# Patient Record
Sex: Male | Born: 1990 | Race: Asian | Hispanic: No | Marital: Single | Smoking: Never smoker
Health system: Southern US, Community
[De-identification: ages and names within clinical notes are randomized; demographics above are authoritative.]

---

## 2016-03-20 ENCOUNTER — Emergency Department (HOSPITAL_COMMUNITY)
Admission: EM | Admit: 2016-03-20 | Discharge: 2016-03-20 | Disposition: A | Discharge status: Home / Self Care | Payer: PRIVATE HEALTH INSURANCE | Attending: Emergency Medicine | Admitting: Emergency Medicine

## 2016-03-20 ENCOUNTER — Encounter (HOSPITAL_COMMUNITY): Payer: Self-pay | Admitting: Nurse Practitioner

## 2016-03-20 ENCOUNTER — Emergency Department (HOSPITAL_COMMUNITY): Payer: PRIVATE HEALTH INSURANCE

## 2016-03-20 DIAGNOSIS — Y998 Other external cause status: Secondary | ICD-10-CM | POA: Insufficient documentation

## 2016-03-20 DIAGNOSIS — Z79899 Other long term (current) drug therapy: Secondary | ICD-10-CM | POA: Diagnosis not present

## 2016-03-20 DIAGNOSIS — Y9367 Activity, basketball: Secondary | ICD-10-CM | POA: Diagnosis not present

## 2016-03-20 DIAGNOSIS — M25561 Pain in right knee: Secondary | ICD-10-CM | POA: Insufficient documentation

## 2016-03-20 DIAGNOSIS — X501XXA Overexertion from prolonged static or awkward postures, initial encounter: Secondary | ICD-10-CM | POA: Diagnosis not present

## 2016-03-20 DIAGNOSIS — Y929 Unspecified place or not applicable: Secondary | ICD-10-CM | POA: Diagnosis not present

## 2016-03-20 MED ORDER — OXYCODONE-ACETAMINOPHEN 5-325 MG PO TABS
1.0000 | ORAL_TABLET | Freq: Once | ORAL | Status: AC
  Administered 2016-03-20: 1 via ORAL
  Filled 2016-03-20: qty 1

## 2016-03-20 MED ORDER — NAPROXEN 500 MG PO TABS: 500.0000 mg | ORAL_TABLET | Freq: Two times a day (BID) | ORAL | 0 refills | Status: AC

## 2016-03-20 NOTE — ED Notes (Signed)
Patient has swelling to right knee.

## 2016-03-20 NOTE — ED Provider Notes (Signed)
[facility]-EMERGENCY DEPT Provider Note   CSN: [id number] Arrival date & time: 03/20/16  1845     History   Chief Complaint Chief Complaint  Patient presents with  . Knee Pain    HPI Oscar Freeman is a 26 y.o. male otherwise healthy presenting with acute onset right knee pain while playing basketball. Patient states that he twisted while playing and felt the pain he has not been able to bear weight since and has tenderness on the lateral right knee and behind his knee. He denies any fall or blunt trauma. denies numbness or tingling in extremities. No other symptoms.  HPI  History reviewed. No pertinent past medical history.  There are no active problems to display for this patient.   History reviewed. No pertinent surgical history.     Home Medications    Prior to Admission medications   Medication Sig Start Date End Date Taking? Authorizing Provider  naproxen (NAPROSYN) 500 MG tablet Take 1 tablet (500 mg total) by mouth 2 (two) times daily. 03/20/16   Georgiana ShoreJessica B Keigan Girten, PA-C    Family History History reviewed. No pertinent family history.  Social History Social History  Substance Use Topics  . Smoking status: Never Smoker  . Smokeless tobacco: Never Used  . Alcohol use No     Allergies   Patient has no known allergies.   Review of Systems Review of Systems  Constitutional: Negative for chills and fever.  HENT: Negative for ear pain.   Eyes: Negative for pain and visual disturbance.  Respiratory: Negative for cough, chest tightness, shortness of breath and wheezing.   Cardiovascular: Negative for chest pain and palpitations.  Gastrointestinal: Negative for abdominal pain, nausea and vomiting.  Musculoskeletal: Positive for arthralgias. Negative for back pain, joint swelling, myalgias, neck pain and neck stiffness.  Skin: Negative for color change, pallor and rash.  Neurological: Negative for dizziness, seizures, syncope, weakness, light-headedness and  numbness.  All other systems reviewed and are negative.    Physical Exam Updated Vital Signs BP 112/74 (BP Location: Right Arm)   Pulse 62   Temp 97.3 F (36.3 C) (Oral)   Resp 15   SpO2 99%   Physical Exam  Constitutional: He appears well-developed and well-nourished. No distress.  Patient is nontoxic-appearing, sitting comfortably in chair in no acute distress.  HENT:  Head: Normocephalic and atraumatic.  Eyes: Conjunctivae and EOM are normal. Pupils are equal, round, and reactive to light.  Neck: Normal range of motion. Neck supple.  Cardiovascular: Normal rate, regular rhythm, normal heart sounds and intact distal pulses.   No murmur heard. Pulmonary/Chest: Effort normal and breath sounds normal. No respiratory distress. He has no wheezes. He has no rales. He exhibits no tenderness.  Musculoskeletal: Normal range of motion. He exhibits tenderness. He exhibits no edema or deformity.  Patient had full passive range of motion of his right knee. Joint was very stable. No swelling noted. Neurovascularly intact. Patient reported tenderness on the lateral aspect of his right knee when palpated. No laxity, negative anterior drawer test.  Neurological: He is alert. No sensory deficit. He exhibits normal muscle tone. Coordination normal.  Skin: Skin is warm and dry. Capillary refill takes less than 2 seconds. No rash noted. He is not diaphoretic. No erythema. No pallor.  Psychiatric: He has a normal mood and affect. His behavior is normal.  Nursing note and vitals reviewed.    ED Treatments / Results  Labs (all labs ordered are listed, but only abnormal results are  displayed) Labs Reviewed - No data to display  EKG  EKG Interpretation None       Radiology Dg Knee Complete 4 Views Right  Result Date: 03/20/2016 CLINICAL DATA:  Right knee injury playing basketball today. Lateral right knee pain. Initial encounter. EXAM: RIGHT KNEE - COMPLETE 4+ VIEW COMPARISON:  None.  FINDINGS: A linear calcific density is seen along the lateral aspect of the lateral tibial plateau, suspicious for avulsion fracture. This is of indeterminate age radiographically. No other fracture or bone lesion identified. No evidence of knee joint effusion or arthropathy. No other bone lesions identified. IMPRESSION: Linear calcific density along lateral aspect of lateral tibial plateau, suspicious for avulsion fracture. This is of indeterminate age radiographically. Recommend clinical correlation for point tenderness at this site. Electronically Signed   By: Myles Rosenthal M.D.   On: 03/20/2016 21:35    Procedures Procedures (including critical care time)  Medications Ordered in ED Medications  oxyCODONE-acetaminophen (PERCOCET/ROXICET) 5-325 MG per tablet 1 tablet (1 tablet Oral Given 03/20/16 2211)     Initial Impression / Assessment and Plan / ED Course  I have reviewed the triage vital signs and the nursing notes.  Pertinent labs & imaging results that were available during my care of the patient were reviewed by me and considered in my medical decision making (see chart for details).    26 year old male presenting with right knee pain of sudden onset while playing basketball. No fall or blunt trauma. He thought that he might have twisted the wrong way. Xray showed area of possible avulsion of unknown date. Mechanism of injury does not correlate with fracture due to lack of trauma or fall at this time. Reassuring exam with stable joint. No swelling noted. Patient did have tenderness on the lateral aspect of the knee. neurovascularly intact.  Discharge home with [facility], nsaids and close follow up with orthopedics on Monday.  Patient's right knee was placed in a brace and provided crutches.  Discussed strict return precautions. Patient was advised to return to the emergency department if experiencing any worsening of symptoms. Patient understood instructions and agreed with discharge  plan.  Final Clinical Impressions(s) / ED Diagnoses   Final diagnoses:  Acute pain of right knee    New Prescriptions Discharge Medication List as of 03/20/2016 11:18 PM    START taking these medications   Details  naproxen (NAPROSYN) 500 MG tablet Take 1 tablet (500 mg total) by mouth 2 (two) times daily., Starting Sat 03/20/2016, Print         Georgiana Shore, PA-C 03/21/16 1610    Jacalyn Lefevre, MD 03/21/16 [phone]

## 2016-03-20 NOTE — ED Notes (Signed)
Patient d/c'd self care.  F/U and medications reviewed.  Patient verbalized understanding. 

## 2016-03-20 NOTE — ED Triage Notes (Signed)
Pt of right knee pain of unknown origin. No obvious swelling. Pain 9/10.

## 2017-11-15 IMAGING — CR DG KNEE COMPLETE 4+V*R*
4 series · 4 of 4 positions shown · non-contrast
Comparison: None.

CLINICAL DATA: Right knee injury playing basketball today. Lateral
right knee pain. Initial encounter.

EXAM:
RIGHT KNEE - COMPLETE 4+ VIEW

[t knee ap right]
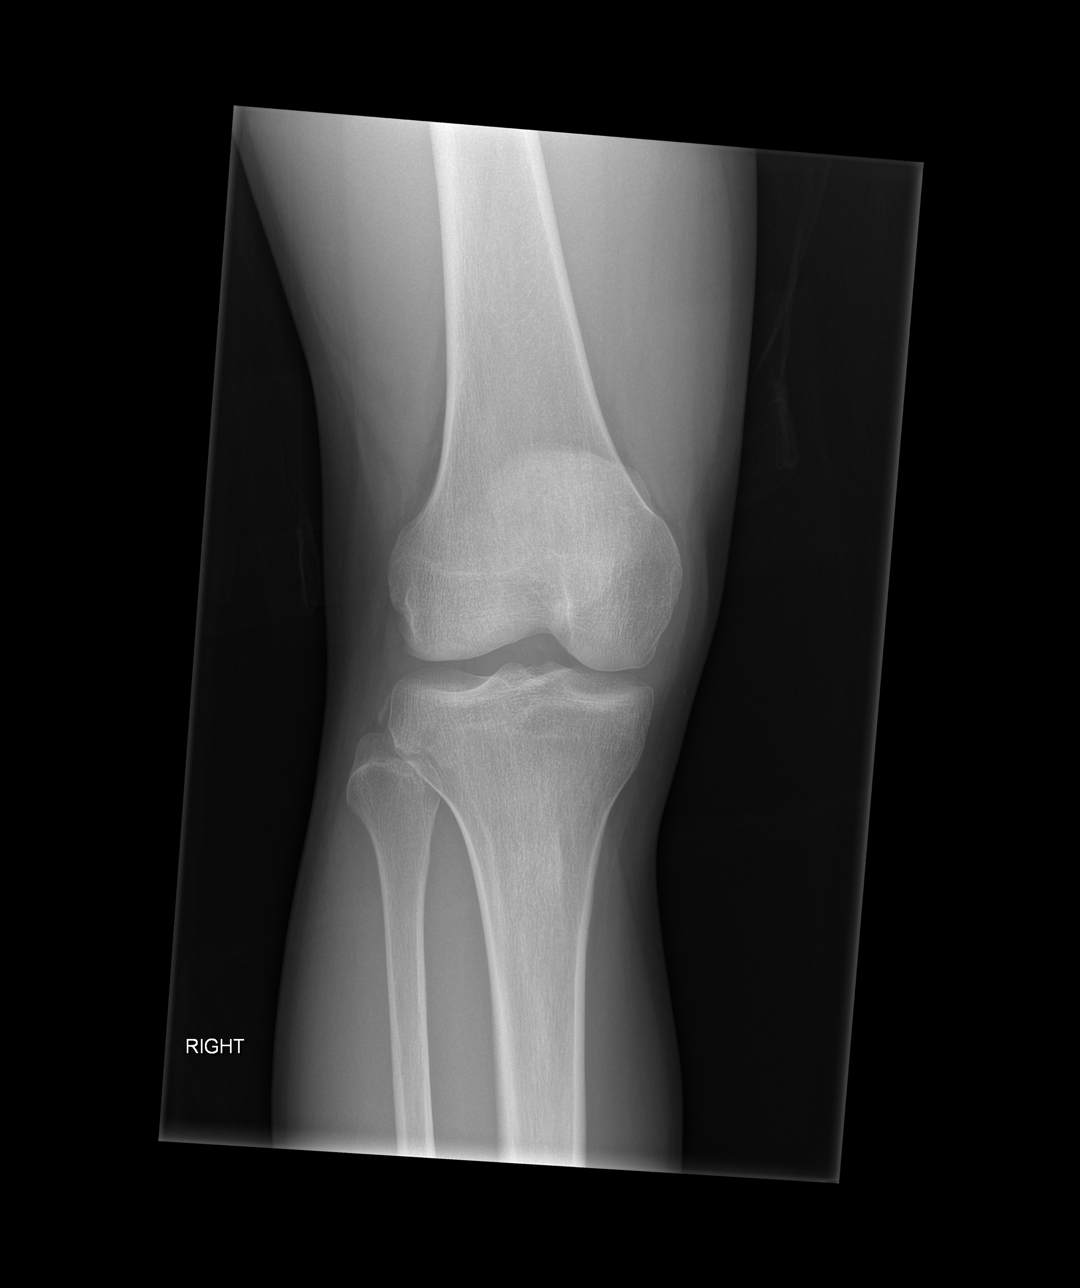

[t knee obl right (1 of 2)]
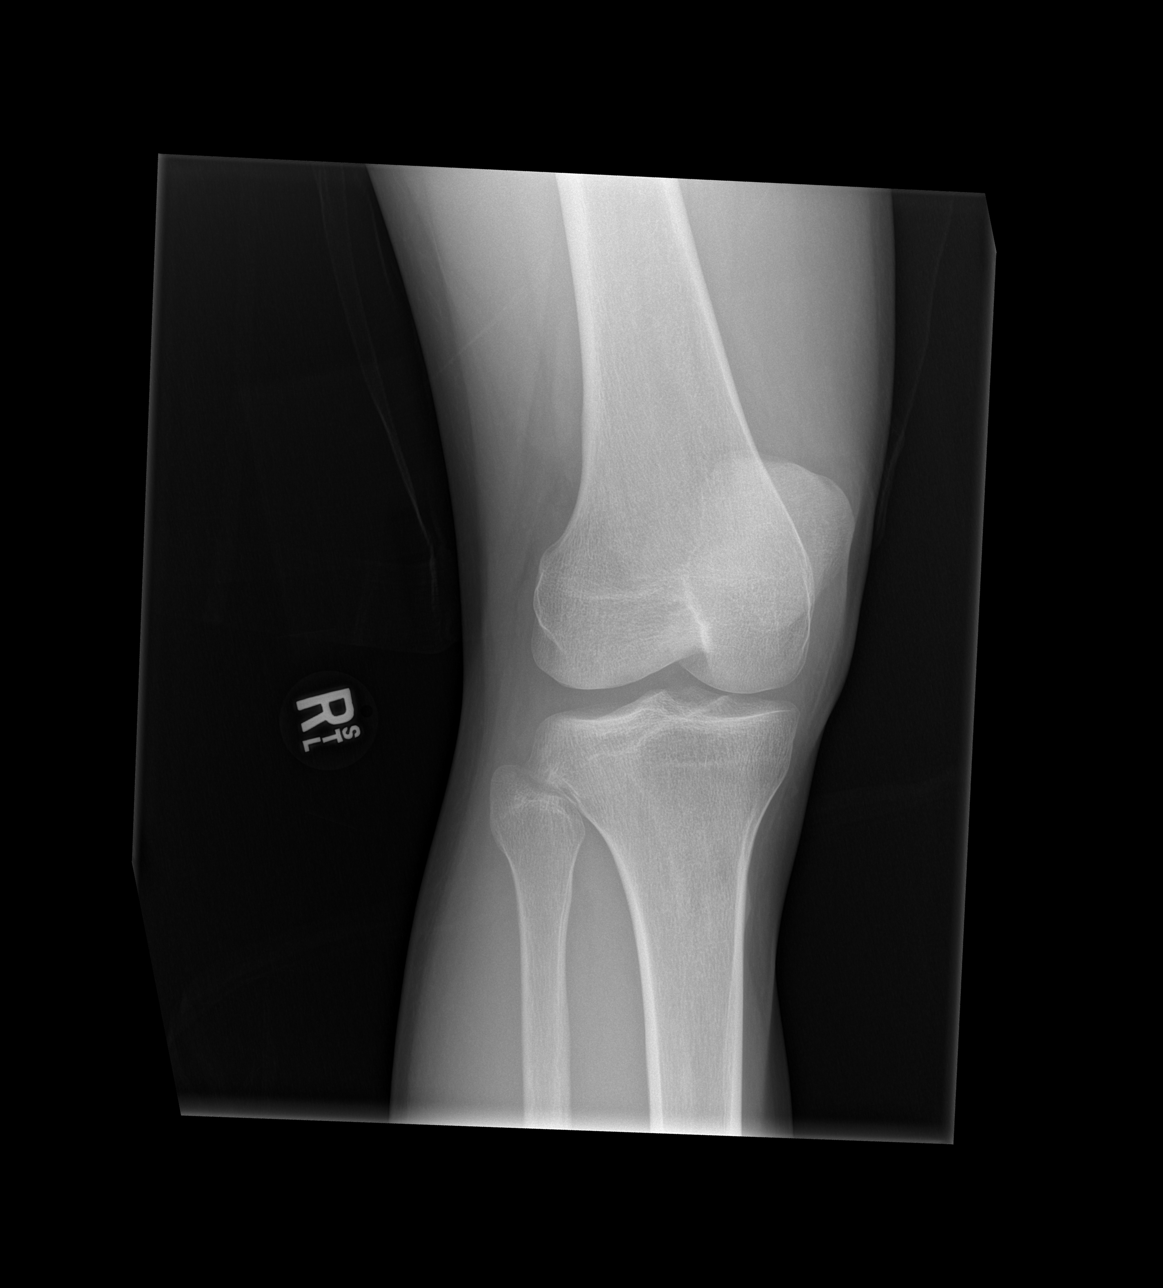

[t knee obl right (2 of 2)]
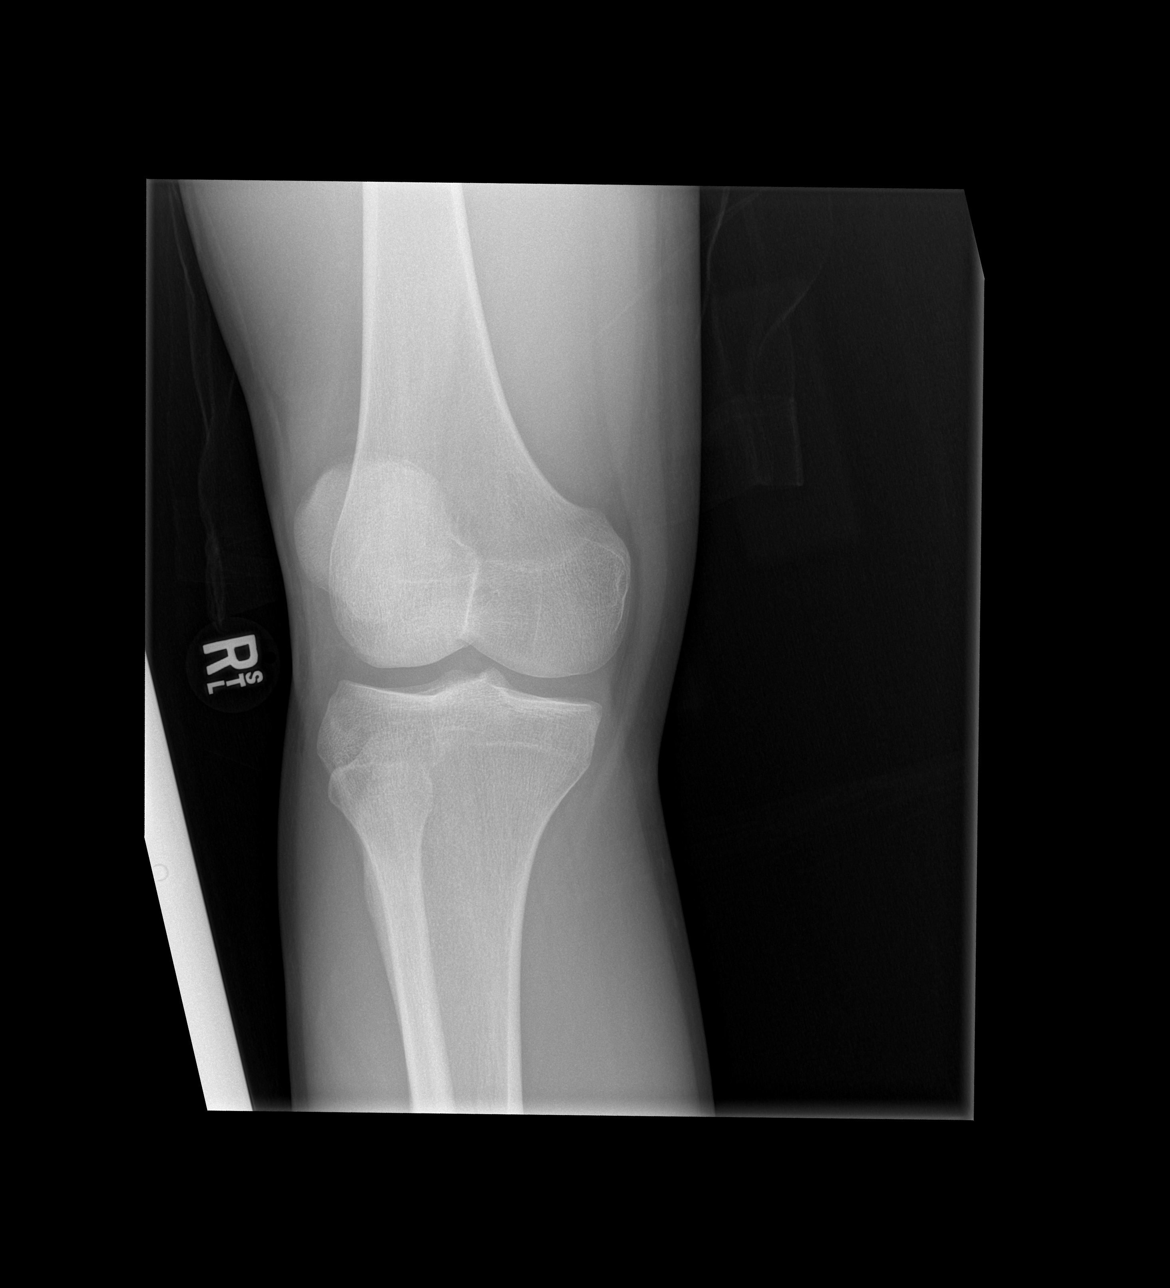

[x knee lat right]
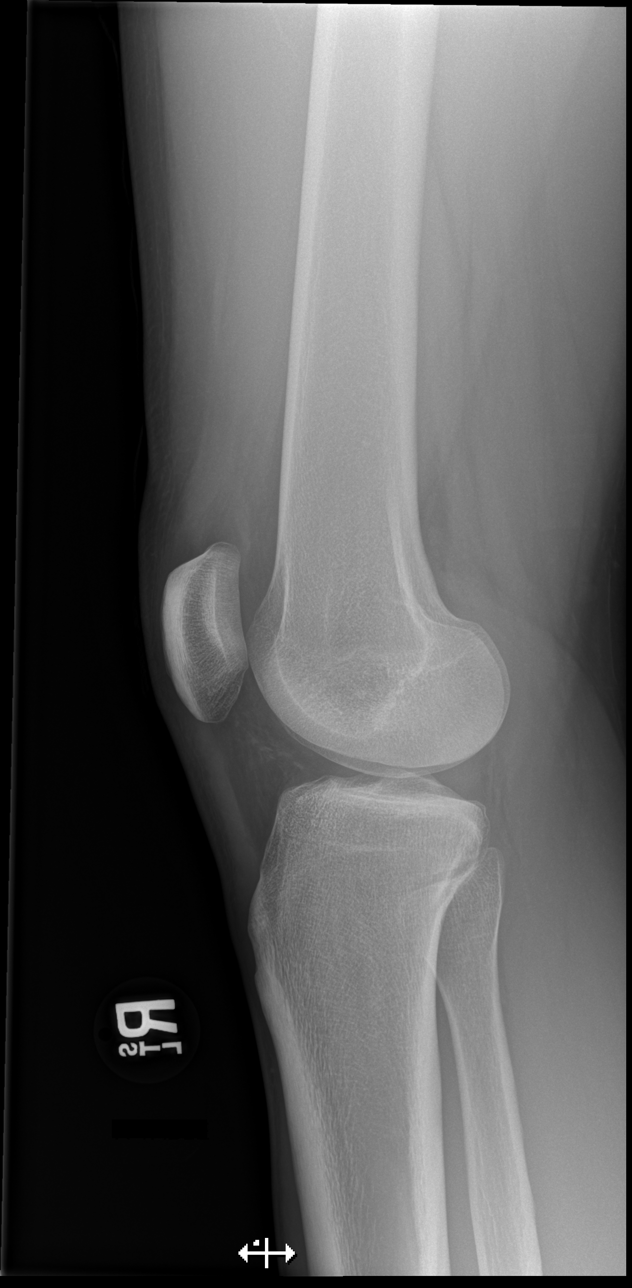

[4 of 4 positions shown; findings below may reference images not displayed]

FINDINGS: A linear calcific density is seen along the lateral aspect of the
lateral tibial plateau, suspicious for avulsion fracture. This is of
indeterminate age radiographically. No other fracture or bone lesion
identified. No evidence of knee joint effusion or arthropathy. No
other bone lesions identified.
IMPRESSION: Linear calcific density along lateral aspect of lateral tibial
plateau, suspicious for avulsion fracture. This is of indeterminate
age radiographically. Recommend clinical correlation for point
tenderness at this site.
